# Patient Record
Sex: Male | Born: 1990 | Race: Black or African American | Hispanic: No | Smoking: Never smoker
Health system: Southern US, Community
[De-identification: ages and names within clinical notes are randomized; demographics above are authoritative.]

---
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
## 2011-05-12 LAB — GC/CHLAMYDIA PROBE AMP, GENITAL: GC Probe Amp, Genital: NEGATIVE

## 2011-05-12 LAB — DIFFERENTIAL
Basophils Relative: 0
Lymphocytes Relative: 38
Monocytes Relative: 9
Neutro Abs: 4

## 2011-05-12 LAB — CBC
Hemoglobin: 15.3
MCHC: 33.2
RBC: 4.93
RDW: 12.6
WBC: 7.8

## 2012-01-14 ENCOUNTER — Encounter (HOSPITAL_COMMUNITY): Payer: Self-pay | Admitting: Emergency Medicine

## 2012-01-14 ENCOUNTER — Emergency Department (INDEPENDENT_AMBULATORY_CARE_PROVIDER_SITE_OTHER)
Admission: EM | Admit: 2012-01-14 | Discharge: 2012-01-14 | Disposition: A | Discharge status: Home / Self Care | Payer: Self-pay | Source: Home / Self Care | Attending: Emergency Medicine | Admitting: Emergency Medicine

## 2012-01-14 DIAGNOSIS — W57XXXA Bitten or stung by nonvenomous insect and other nonvenomous arthropods, initial encounter: Secondary | ICD-10-CM

## 2012-01-14 DIAGNOSIS — T148 Other injury of unspecified body region: Secondary | ICD-10-CM
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
## 2012-01-14 NOTE — ED Provider Notes (Signed)
Medical screening examination/treatment/procedure(s) were performed by non-physician practitioner and as supervising physician I was immediately available for consultation/collaboration.  Leslee Home, M.D.   Reuben Likes, MD 01/14/12 [phone]

## 2012-01-14 NOTE — ED Notes (Signed)
PATIENT NOT IN TREATMENT ROOM

## 2012-01-14 NOTE — ED Notes (Signed)
Recently went out of town, stayed [employer].  While out of town notice "bites" on right arm.  Patient reports area on right forearm having clear liquid.  Visible minimal scabbing to right forearm at wrist, appears dry

## 2012-01-14 NOTE — Discharge Instructions (Signed)
Insect Bite Mosquitoes, flies, fleas, bedbugs, and many other insects can bite. Insect bites are different from insect stings. A sting is when venom is injected into the skin. Some insect bites can transmit infectious diseases. SYMPTOMS  Insect bites usually turn red, swell, and itch for 2 to 4 days. They often go away on their own. TREATMENT  Your caregiver may prescribe antibiotic medicines if a bacterial infection develops in the bite. HOME CARE INSTRUCTIONS  Do not scratch the bite area.   Keep the bite area clean and dry. Wash the bite area thoroughly with soap and water.   Put ice or cool compresses on the bite area.   Put ice in a plastic bag.   Place a towel between your skin and the bag.   Leave the ice on for 20 minutes, 4 times a day for the first 2 to 3 days, or as directed.   You may apply a baking soda paste, cortisone cream, or calamine lotion to the bite area as directed by your caregiver. This can help reduce itching and swelling.   Only take over-the-counter or prescription medicines as directed by your caregiver.   If you are given antibiotics, take them as directed. Finish them even if you start to feel better.  You may need a tetanus shot if:  You cannot remember when you had your last tetanus shot.   You have never had a tetanus shot.   The injury broke your skin.  If you get a tetanus shot, your arm may swell, get red, and feel warm to the touch. This is common and not a problem. If you need a tetanus shot and you choose not to have one, there is a rare chance of getting tetanus. Sickness from tetanus can be serious. SEEK IMMEDIATE MEDICAL CARE IF:   You have increased pain, redness, or swelling in the bite area.   You see a red line on the skin coming from the bite.   You have a fever.   You have joint pain.   You have a headache or neck pain.   You have unusual weakness.   You have a rash.   You have chest pain or shortness of breath.   You  have abdominal pain, nausea, or vomiting.   You feel unusually tired or sleepy.  MAKE SURE YOU:   Understand these instructions.   Will watch your condition.   Will get help right away if you are not doing well or get worse.  Document Released: 08/19/2004 Document Revised: 07/01/2011 Document Reviewed: 02/10/2011 [employer] Patient Information 2012 [employer]. 

## 2015-11-06 ENCOUNTER — Encounter (HOSPITAL_COMMUNITY): Payer: Self-pay | Admitting: Emergency Medicine

## 2015-11-06 ENCOUNTER — Emergency Department (HOSPITAL_COMMUNITY)
Admission: EM | Admit: 2015-11-06 | Discharge: 2015-11-06 | Discharge status: Against Medical Advice | Payer: Self-pay | Attending: Emergency Medicine | Admitting: Emergency Medicine

## 2015-11-06 DIAGNOSIS — R0602 Shortness of breath: Secondary | ICD-10-CM | POA: Insufficient documentation

## 2015-11-06 NOTE — Discharge Instructions (Signed)
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
## 2015-11-06 NOTE — ED Notes (Signed)
Bed: WA02 Expected date:  Expected time:  Means of arrival:  Comments: EMS 25 yo male SOB/HR 100-? Cocaine ingestion

## 2015-11-06 NOTE — ED Notes (Signed)
PT states he still wants to leave.

## 2015-11-06 NOTE — ED Provider Notes (Signed)
CSN: [id number]     Arrival date & time 11/06/15  0056 History   None    Chief Complaint  Patient presents with  . Shortness of Breath     (Consider location/radiation/quality/duration/timing/severity/associated sxs/prior Treatment) HPI Comments: Patient brought to the emergency department by EMS and police. Patient was arrested and on his way to jail when he began to state that he was short of breath. He vomited once. He reports that the symptoms have completely resolved. He is not expressing any chest pain, shortness of breath, dizziness or feeling like he is going to pass out. Patient reports that he does not want to be seen.  Patient is a 25 y.o. male presenting with shortness of breath.  Shortness of Breath   No past medical history on file. No past surgical history on file. No family history on file. Social History  Substance Use Topics  . Smoking status: Not on file  . Smokeless tobacco: Not on file  . Alcohol Use: Not on file    Review of Systems  Respiratory: Positive for shortness of breath.   All other systems reviewed and are negative.     Allergies  Review of patient's allergies indicates not on file.  Home Medications   Prior to Admission medications   Not on File   There were no vitals taken for this visit. Physical Exam  Constitutional: He is oriented to person, place, and time. He appears well-developed and well-nourished. No distress.  HENT:  Head: Normocephalic and atraumatic.  Right Ear: Hearing normal.  Left Ear: Hearing normal.  Nose: Nose normal.  Mouth/Throat: Oropharynx is clear and moist and mucous membranes are normal.  Eyes: Conjunctivae and EOM are normal. Pupils are equal, round, and reactive to light.  Neck: Normal range of motion. Neck supple.  Cardiovascular: Regular rhythm, S1 normal and S2 normal.  Exam reveals no gallop and no friction rub.   No murmur heard. Pulmonary/Chest: Effort normal and breath sounds normal. No  respiratory distress. He exhibits no tenderness.  Abdominal: Soft. Normal appearance and bowel sounds are normal. There is no hepatosplenomegaly. There is no tenderness. There is no rebound, no guarding, no tenderness at McBurney's point and negative Murphy's sign. No hernia.  Musculoskeletal: Normal range of motion.  Neurological: He is alert and oriented to person, place, and time. He has normal strength. No cranial nerve deficit or sensory deficit. Coordination normal. GCS eye subscore is 4. GCS verbal subscore is 5. GCS motor subscore is 6.  Skin: Skin is warm, dry and intact. No rash noted. No cyanosis.  Psychiatric: He has a normal mood and affect. His speech is normal and behavior is normal. Thought content normal.  Nursing note and vitals reviewed.   ED Course  Procedures (including critical care time) Labs Review Labs Reviewed - No data to display  Imaging Review No results found. I have personally reviewed and evaluated these images and lab results as part of my medical decision-making.   EKG Interpretation None      MDM   Final diagnoses:  None  Shortness of breath  At arrival to the emergency department patient reports that he is no longer short of breath and does not want to be seen. I did evaluate the patient in his room. His examination is normal. He does not appear to be impaired or incapacitated in any way. He declines workup. I did inform him that if he was having a longer heart problems can be fatal. He also was  told that if he ingested cocaine he needs to be evaluated for this, but he denies ingestion and does not want to be seen. He reports that he wants to go to jail directly and not have any labs, EKG, x-rays performed. He understands risks of missing a significant lung or heart abnormality including permanent disability and death. He was discharged AGAINST MEDICAL ADVICE.    Gilda Crease, MD 11/06/15 [phone]

## 2018-05-07 ENCOUNTER — Encounter (HOSPITAL_COMMUNITY): Payer: Self-pay | Admitting: Emergency Medicine

## 2018-05-07 ENCOUNTER — Ambulatory Visit (HOSPITAL_COMMUNITY)
Admission: EM | Admit: 2018-05-07 | Discharge: 2018-05-07 | Disposition: A | Discharge status: Home / Self Care | Payer: Self-pay | Attending: Family Medicine | Admitting: Family Medicine

## 2018-05-07 DIAGNOSIS — N485 Ulcer of penis: Secondary | ICD-10-CM | POA: Insufficient documentation

## 2018-05-07 DIAGNOSIS — F17200 Nicotine dependence, unspecified, uncomplicated: Secondary | ICD-10-CM | POA: Insufficient documentation

## 2018-05-07 DIAGNOSIS — R300 Dysuria: Secondary | ICD-10-CM | POA: Insufficient documentation

## 2018-05-07 DIAGNOSIS — Z113 Encounter for screening for infections with a predominantly sexual mode of transmission: Secondary | ICD-10-CM

## 2018-05-07 DIAGNOSIS — N5089 Other specified disorders of the male genital organs: Secondary | ICD-10-CM

## 2018-05-07 LAB — RAPID HIV SCREEN (HIV 1/2 AB+AG)
HIV 1/2 Antibodies: NONREACTIVE
HIV-1 P24 Antigen - HIV24: NONREACTIVE

## 2018-05-07 MED ORDER — CEFTRIAXONE SODIUM 250 MG IJ SOLR
250.0000 mg | Freq: Once | INTRAMUSCULAR | Status: AC
  Administered 2018-05-07: 250 mg via INTRAMUSCULAR

## 2018-05-07 MED ORDER — CEFTRIAXONE SODIUM 250 MG IJ SOLR
INTRAMUSCULAR | Status: AC
  Filled 2018-05-07: qty 250

## 2018-05-07 MED ORDER — AZITHROMYCIN 250 MG PO TABS
ORAL_TABLET | ORAL | Status: AC
Start: 2018-05-07 — End: ?
  Filled 2018-05-07: qty 4

## 2018-05-07 MED ORDER — AZITHROMYCIN 250 MG PO TABS
1000.0000 mg | ORAL_TABLET | Freq: Once | ORAL | Status: AC
  Administered 2018-05-07: 1000 mg via ORAL

## 2018-05-07 NOTE — ED Provider Notes (Signed)
[facility]    CSN: [id number] Arrival date & time: 05/07/18  1022     History   Chief Complaint Chief Complaint  Patient presents with  . SEXUALLY TRANSMITTED DISEASE    HPI Casey Macias is a 27 y.o. male.   27 yo man presents for evaluation of STD.  He has had a new contact recently and he woke up this morning with extreme burning when he passes his urine.  He also notices an open sore on the dorsal aspect of his penis.     History reviewed. No pertinent past medical history.  There are no active problems to display for this patient.   History reviewed. No pertinent surgical history.     Home Medications    Prior to Admission medications   Not on File    Family History No family history on file.  Social History Social History   Tobacco Use  . Smoking status: Current Every Day Smoker  Substance Use Topics  . Alcohol use: Yes  . Drug use: Yes    Types: Marijuana     Allergies   Patient has no known allergies.   Review of Systems Review of Systems  Genitourinary: Positive for dysuria and genital sores.  All other systems reviewed and are negative.    Physical Exam Triage Vital Signs ED Triage Vitals [05/07/18 1040]  Enc Vitals Group     BP 127/63     Pulse Rate 69     Resp 16     Temp 98.5 F (36.9 C)     Temp Source Oral     SpO2 99 %     Weight      Height      Head Circumference      Peak Flow      Pain Score      Pain Loc      Pain Edu?      Excl. in GC?    No data found.  Updated Vital Signs BP 127/63 (BP Location: Left Arm)   Pulse 69   Temp 98.5 F (36.9 C) (Oral)   Resp 16   SpO2 99%    Physical Exam  Constitutional: He is oriented to person, place, and time. He appears well-developed and well-nourished.  HENT:  Right Ear: External ear normal.  Left Ear: External ear normal.  Mouth/Throat: Oropharynx is clear and moist.  Eyes: Pupils are equal, round, and reactive to light. Conjunctivae are  normal.  Neck: Normal range of motion. Neck supple.  Pulmonary/Chest: Effort normal.  Genitourinary:  Genitourinary Comments: 4 mm open sore on dorsal aspect of penis at its base.  Musculoskeletal: Normal range of motion.  Neurological: He is alert and oriented to person, place, and time.  Skin: Skin is warm and dry.  Nursing note and vitals reviewed.    UC Treatments / Results  Labs (all labs ordered are listed, but only abnormal results are displayed) Labs Reviewed  HIV ANTIBODY (ROUTINE TESTING W REFLEX)  RAPID HIV SCREEN (HIV 1/2 AB+AG)  URINE CYTOLOGY ANCILLARY ONLY    EKG None  Radiology No results found.  Procedures Procedures (including critical care time)  Medications Ordered in UC Medications  azithromycin (ZITHROMAX) tablet 1,000 mg (has no administration in time range)  cefTRIAXone (ROCEPHIN) injection 250 mg (has no administration in time range)    Initial Impression / Assessment and Plan / UC Course  I have reviewed the triage vital signs and the nursing notes.  Pertinent labs & imaging results that were available during my care of the patient were reviewed by me and considered in my medical decision making (see chart for details).    Final Clinical Impressions(s) / UC Diagnoses   Final diagnoses:  Dysuria  Ulcers of genital organ in male     Discharge Instructions     We are running test to see what kind of infection you might have acquired.  In the meantime, were giving you medicine that covers the most common causes of this burning.  You can go online and check for results as they will show up on your record in the next 24 hours    ED Prescriptions    None     Controlled Substance Prescriptions [place] Controlled Substance Registry consulted? Not Applicable   Elvina Sidle, MD 05/07/18 1103

## 2018-05-07 NOTE — ED Triage Notes (Signed)
Pt states "I want to get checked out, when I pee it doesn't feel right". Pt will not elaborate further.

## 2018-05-07 NOTE — Discharge Instructions (Signed)
We are running test to see what kind of infection you might have acquired.  In the meantime, were giving you medicine that covers the most common causes of this burning.  You can go online and check for results as they will show up on your record in the next 24 hours

## 2018-05-08 LAB — URINE CYTOLOGY ANCILLARY ONLY
Chlamydia: NEGATIVE
Neisseria Gonorrhea: NEGATIVE
Trichomonas: NEGATIVE

## 2018-05-09 LAB — HIV ANTIBODY (ROUTINE TESTING W REFLEX): HIV Screen 4th Generation wRfx: NONREACTIVE

## 2019-09-09 ENCOUNTER — Other Ambulatory Visit: Payer: Self-pay

## 2019-09-09 ENCOUNTER — Emergency Department (HOSPITAL_COMMUNITY): Payer: Self-pay

## 2019-09-09 ENCOUNTER — Emergency Department (HOSPITAL_COMMUNITY)
Admission: EM | Admit: 2019-09-09 | Discharge: 2019-09-09 | Disposition: A | Discharge status: Home / Self Care | Payer: Self-pay | Attending: Emergency Medicine | Admitting: Emergency Medicine

## 2019-09-09 DIAGNOSIS — S60221A Contusion of right hand, initial encounter: Secondary | ICD-10-CM | POA: Insufficient documentation

## 2019-09-09 DIAGNOSIS — Y999 Unspecified external cause status: Secondary | ICD-10-CM | POA: Insufficient documentation

## 2019-09-09 DIAGNOSIS — K644 Residual hemorrhoidal skin tags: Secondary | ICD-10-CM | POA: Insufficient documentation

## 2019-09-09 DIAGNOSIS — Y939 Activity, unspecified: Secondary | ICD-10-CM | POA: Insufficient documentation

## 2019-09-09 DIAGNOSIS — W2209XA Striking against other stationary object, initial encounter: Secondary | ICD-10-CM | POA: Insufficient documentation

## 2019-09-09 DIAGNOSIS — Y929 Unspecified place or not applicable: Secondary | ICD-10-CM | POA: Insufficient documentation

## 2019-09-09 DIAGNOSIS — F17210 Nicotine dependence, cigarettes, uncomplicated: Secondary | ICD-10-CM | POA: Insufficient documentation

## 2019-09-09 DIAGNOSIS — F1210 Cannabis abuse, uncomplicated: Secondary | ICD-10-CM | POA: Insufficient documentation

## 2019-09-09 MED ORDER — ACETAMINOPHEN 500 MG PO TABS
1000.0000 mg | ORAL_TABLET | Freq: Once | ORAL | Status: AC
  Administered 2019-09-09: 10:00:00 1000 mg via ORAL
  Filled 2019-09-09: qty 2

## 2019-09-09 NOTE — ED Triage Notes (Signed)
Pt stated that he broke his Rt "pinkie knuckle" & he has a painful hemorrhoid that is difficulty to sit.

## 2019-09-09 NOTE — ED Notes (Signed)
Patient transported to X-ray 

## 2019-09-09 NOTE — Discharge Instructions (Addendum)
Please increase your fiber intake as we discussed.  You can do this through dietary changes as well as with Metamucil or MiraLAX which are over-the-counter fiber supplements.  As we discussed vegetables, lettuce, other sources of roughage are good for fiber.  I have printed out information on sitz baths for you to read.  Please do these numerous times throughout the day if you are able to.  Please use Preparation H wipes with every bowel movement and in between as well.  You may follow-up with surgery for consideration of possible hemorrhoidectomy which is a surgical procedure

## 2019-09-09 NOTE — ED Provider Notes (Signed)
[facility] Provider Note   CSN: [id number] Arrival date & time: 09/09/19  1660     History Chief Complaint  Patient presents with  . hand injury  . Hemorrhoids    Casey Macias is a 29 y.o. male.  HPI  Patient is a 29 year old male with chief complaint of hemorrhoid and hand pain.  Patient states he punched a wall 4 days ago and has had persistent right fifth MCP knuckle pain since that time.  It is constant, achy, worse with movement tender to touch with no other aggravating or mitigating factors.  He has taken no medications for this prior to arrival.  Patient states he has a history of hemorrhoids and over the past several days has had worsening pain in his anus.  He states is worse with bowel movements.  Denies any blood in his stool but states it feels very painful.  He states he feels a lump down there as well.  He states that it is in the same location that he had a hemorrhoid before.  He used a suppository which provided him with some relief.  Patient states that he is a "carnivore "and does not eat any vegetables.  Patient denies any nausea, vomiting, headache, dizziness, lightheadedness, abdominal pain, chest pain, shortness of breath.  Denies any fevers or chills.    No past medical history on file.  There are no problems to display for this patient.   No past surgical history on file.     No family history on file.  Social History   Tobacco Use  . Smoking status: Current Every Day Smoker  Substance Use Topics  . Alcohol use: Yes  . Drug use: Yes    Types: Marijuana    Home Medications Prior to Admission medications   Not on File    Allergies    Patient has no known allergies.  Review of Systems   Review of Systems  Constitutional: Negative for chills and fever.  HENT: Negative for congestion.   Eyes: Negative for pain.  Respiratory: Negative for cough and shortness of breath.   Cardiovascular:  Negative for chest pain and leg swelling.  Gastrointestinal: Negative for abdominal pain and vomiting.       Hemorrhoid  Genitourinary: Negative for dysuria.  Musculoskeletal: Negative for myalgias.       Hand pain  Skin: Negative for rash.  Neurological: Negative for dizziness and headaches.    Physical Exam Updated Vital Signs Temp 98.1 F (36.7 C) (Oral)   Ht 5\' 7"  (1.702 m)   Wt 76.2 kg   BMI 26.31 kg/m   Physical Exam Vitals and nursing note reviewed.  Constitutional:      General: He is not in acute distress.    Appearance: Normal appearance. He is not ill-appearing.  HENT:     Head: Normocephalic and atraumatic.     Mouth/Throat:     Mouth: Mucous membranes are moist.  Eyes:     General: No scleral icterus.       Right eye: No discharge.        Left eye: No discharge.     Conjunctiva/sclera: Conjunctivae normal.  Pulmonary:     Effort: Pulmonary effort is normal.     Breath sounds: No stridor.  Abdominal:     Tenderness: There is no abdominal tenderness.  Genitourinary:    Comments: Large, 1 cm in diameter thrombosed grade 3 hemorrhoid at the 12 o'clock position of the anus.  Tender to palpation.  Patient is unable to tolerate internal exam and defers.  There is no redness or bleeding.  There is no signs of infection. Musculoskeletal:     Comments: Tenderness to palpation of the right hand at the fifth MCP, there is some swelling.  Small amount of bruising.  No laceration or abrasion.  Patient is able to wiggle fingers and has sensation in all extremities in all fingertips.  Has good cap refill.  Neurological:     Mental Status: He is alert and oriented to person, place, and time. Mental status is at baseline.  Psychiatric:        Mood and Affect: Mood normal.        Behavior: Behavior normal.     ED Results / Procedures / Treatments   Labs (all labs ordered are listed, but only abnormal results are displayed) Labs Reviewed - No data to  display  EKG None  Radiology DG Hand Complete Right  Result Date: 09/09/2019 CLINICAL DATA:  Recent punching injury with fifth metacarpal pain, initial encounter EXAM: RIGHT HAND - COMPLETE 3+ VIEW COMPARISON:  11/23/2007 FINDINGS: Soft tissue swelling is noted over the distal aspect of the fifth metacarpal. No definitive acute fracture or dislocation is seen. Tiny density is noted adjacent to the base of the fourth proximal phalanx on the frontal view only. This is well corticated and is likely related to prior trauma. IMPRESSION: Changes suggestive of old trauma at the base of the fourth proximal phalanx. Soft tissue swelling consistent with the recent injury without evidence of acute fracture. Electronically Signed   By: Alcide Clever M.D.   On: 09/09/2019 09:49    Procedures Procedures (including critical care time)  Medications Ordered in ED Medications  acetaminophen (TYLENOL) tablet 1,000 mg (1,000 mg Oral Given 09/09/19 7846)    ED Course  I have reviewed the triage vital signs and the nursing notes.  Pertinent labs & imaging results that were available during my care of the patient were reviewed by me and considered in my medical decision making (see chart for details).    MDM Rules/Calculators/A&P                      Patient is 28 year old healthy male presented today with complaints of hemorrhoid and right hand pain after punching a wall.  Physical exam remarkable for swelling of the right MCP of the fifth digit.  Concern for fracture.  X-ray confirms no fracture however.  Rectal exam conducted-patient was unable to tolerate internal exam.  I discussed with him that I am unable to see if there is a internal hemorrhoid or rectal abscess without on exam he is understanding of this and continues to decline.  He understands the risks of this and would like to defer.  For hand injury-patient recommended to use Tylenol ibuprofen, rest ice and elevate injured hand.  For  hemorrhoids-patient discharged with conservative therapy of sitz bath, Anusol, increased fiber, Preparation H external wipes, increase hydration and Tylenol and ibuprofen.  He will also be provided with surgery referral as grade 3 thrombosed hemorrhoids often require definitive therapy.  Final Clinical Impression(s) / ED Diagnoses Final diagnoses:  External hemorrhoids  Contusion of right hand, initial encounter    Rx / DC Orders ED Discharge Orders    None       Gailen Shelter, [place] 09/09/19 1031    Pricilla Loveless, MD 09/09/19 1213
